# Patient Record
Sex: Male | Born: 2000 | Race: White | Hispanic: No | Marital: Single | Smoking: Never smoker
Health system: Southern US, Community
[De-identification: ages and names within clinical notes are randomized; demographics above are authoritative.]

## PROBLEM LIST (undated history)

## (undated) DIAGNOSIS — Z9229 Personal history of other drug therapy: Secondary | ICD-10-CM

## (undated) DIAGNOSIS — S83207A Unspecified tear of unspecified meniscus, current injury, left knee, initial encounter: Secondary | ICD-10-CM

## (undated) DIAGNOSIS — S8990XA Unspecified injury of unspecified lower leg, initial encounter: Secondary | ICD-10-CM

## (undated) DIAGNOSIS — T8489XA Other specified complication of internal orthopedic prosthetic devices, implants and grafts, initial encounter: Secondary | ICD-10-CM

## (undated) HISTORY — PX: TONSILLECTOMY AND ADENOIDECTOMY: SUR1326

---
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
## 2019-01-29 DIAGNOSIS — R070 Pain in throat: Secondary | ICD-10-CM | POA: Diagnosis not present

## 2019-01-29 DIAGNOSIS — R509 Fever, unspecified: Secondary | ICD-10-CM | POA: Diagnosis not present

## 2019-01-29 DIAGNOSIS — J029 Acute pharyngitis, unspecified: Secondary | ICD-10-CM | POA: Diagnosis not present

## 2019-01-29 DIAGNOSIS — B070 Plantar wart: Secondary | ICD-10-CM | POA: Diagnosis not present

## 2019-02-03 ENCOUNTER — Encounter: Payer: Self-pay | Admitting: Nurse Practitioner

## 2019-02-20 ENCOUNTER — Other Ambulatory Visit: Payer: Self-pay

## 2019-02-20 ENCOUNTER — Encounter: Payer: Self-pay | Admitting: Emergency Medicine

## 2019-02-20 DIAGNOSIS — R197 Diarrhea, unspecified: Secondary | ICD-10-CM | POA: Diagnosis not present

## 2019-02-20 DIAGNOSIS — R1084 Generalized abdominal pain: Secondary | ICD-10-CM | POA: Insufficient documentation

## 2019-02-20 LAB — COMPREHENSIVE METABOLIC PANEL
ALBUMIN: 4.6 g/dL (ref 3.5–5.0)
ALK PHOS: 49 U/L (ref 38–126)
ALT: 50 U/L — ABNORMAL HIGH (ref 0–44)
AST: 35 U/L (ref 15–41)
Anion gap: 6 (ref 5–15)
BUN: 21 mg/dL — AB (ref 6–20)
CALCIUM: 8.8 mg/dL — AB (ref 8.9–10.3)
CO2: 25 mmol/L (ref 22–32)
CREATININE: 0.92 mg/dL (ref 0.61–1.24)
Chloride: 109 mmol/L (ref 98–111)
GFR calc Af Amer: >60 mL/min (ref >60)
GFR calc non Af Amer: >60 mL/min (ref >60)
GLUCOSE: 111 mg/dL — AB (ref 70–99)
Potassium: 4.1 mmol/L (ref 3.5–5.1)
Sodium: 140 mmol/L (ref 135–145)
TOTAL PROTEIN: 6.6 g/dL (ref 6.5–8.1)
Total Bilirubin: 0.7 mg/dL (ref 0.3–1.2)

## 2019-02-20 LAB — URINALYSIS, COMPLETE (UACMP) WITH MICROSCOPIC
BILIRUBIN URINE: NEGATIVE
Bacteria, UA: NONE SEEN
Glucose, UA: NEGATIVE mg/dL
HGB URINE DIPSTICK: NEGATIVE
Ketones, ur: NEGATIVE mg/dL
Leukocytes,Ua: NEGATIVE
Nitrite: NEGATIVE
PH: 6 (ref 5.0–8.0)
Protein, ur: NEGATIVE mg/dL
SPECIFIC GRAVITY, URINE: 1.021 (ref 1.005–1.030)
WBC, UA: NONE SEEN WBC/hpf (ref 0–5)

## 2019-02-20 LAB — LIPASE, BLOOD: Lipase: 30 U/L (ref 11–51)

## 2019-02-20 LAB — CBC
HCT: 39.6 % (ref 39.0–52.0)
Hemoglobin: 14.4 g/dL (ref 13.0–17.0)
MCH: 31.7 pg (ref 26.0–34.0)
MCHC: 36.4 g/dL — AB (ref 30.0–36.0)
MCV: 87.2 fL (ref 80.0–100.0)
PLATELETS: 214 10*3/uL (ref 150–400)
RBC: 4.54 MIL/uL (ref 4.22–5.81)
RDW: 11.8 % (ref 11.5–15.5)
WBC: 4.8 10*3/uL (ref 4.0–10.5)
nRBC: 0 % (ref 0.0–0.2)

## 2019-02-20 NOTE — ED Triage Notes (Signed)
Patient ambulatory to triage with steady gait, without difficulty or distress noted; pt reports generalized abd pain since this morning accomp by diarrhea

## 2019-02-21 ENCOUNTER — Ambulatory Visit (INDEPENDENT_AMBULATORY_CARE_PROVIDER_SITE_OTHER): Payer: Medicaid Other | Admitting: Nurse Practitioner

## 2019-02-21 ENCOUNTER — Encounter: Payer: Self-pay | Admitting: Nurse Practitioner

## 2019-02-21 ENCOUNTER — Other Ambulatory Visit: Payer: Self-pay

## 2019-02-21 ENCOUNTER — Emergency Department
Admission: EM | Admit: 2019-02-21 | Discharge: 2019-02-21 | Disposition: A | Discharge status: Home / Self Care | Payer: Medicaid Other | Attending: Emergency Medicine | Admitting: Emergency Medicine

## 2019-02-21 VITALS — BP 123/65 | HR 61 | Temp 98.3°F | Ht 71.0 in | Wt 210.2 lb

## 2019-02-21 DIAGNOSIS — A084 Viral intestinal infection, unspecified: Secondary | ICD-10-CM | POA: Diagnosis not present

## 2019-02-21 NOTE — ED Notes (Signed)
Pt called from lobby with no reply. Unable to locate pt at this time. Pt did not announce leaving.

## 2019-02-21 NOTE — ED Notes (Signed)
Pt called from lobby to be taken to exam room. Unable to locate pt at this time.  

## 2019-02-21 NOTE — Patient Instructions (Addendum)
Raymond Rogers,   Thank you for coming in to clinic today.  1. Your symptoms are likely from Viral Gastroenteritis -  A stomach bug. This should significantly improve within about 24-48 hours and full improvement in about 1 week. - It is important to continue hydration.  Drink at least 2 L water daily.  You may start drinking some gatorade or powerade if you are unable to eat enough food. - Continue Tylenol every 6 hours if needed for fevers or aches and pains. - Avoid ibuprofen.  - If continue to have cramping and diarrhea, may take Imodium AD over the counter as needed. - For stomach pain, may start Pepcid 20 mg twice daily - take 14 days then only as needed.  If symptoms are worsening, decreased urine output (no urination within 12 hours), persistent fevers higher than 101F for more than 3 days, worsening or increased vomiting, persistent diarrhea after 5-7 days, decreased appetite, please call or return to clinic, may go to Urgent Care or ED.   Please schedule a follow-up appointment with Wilhelmina Mcardle, AGNP.  Return 5-7 days if symptoms worsen or fail to improve.  If you have any other questions or concerns, please feel free to call the clinic or send a message through MyChart. You may also schedule an earlier appointment if necessary.  You will receive a survey after today's visit either digitally by e-mail or paper by [employer]. Your experiences and feedback matter to [place].  Please respond so we know how we are doing as we provide care for you.   Wilhelmina Mcardle, DNP, AGNP-BC Adult Gerontology Nurse Practitioner [facility], [facility]

## 2019-02-21 NOTE — Progress Notes (Signed)
Subjective:    Patient ID: Raymond Rogers, male    DOB: 02/28/2001, 18 y.o.   MRN: [id number]  Raymond Rogers is a 18 y.o. male presenting on 02/21/2019 for Abdominal Pain (intermittent abdominal pain mostly in the upper abdomen w/ diarrhea. The pt describe the pain as a tight feeling.   He reports that the symptoms started after playing basketball. x 1 day)   HPI Abdominal pain Patient has had increased abdominal pain and diarrhea for 1.5 days.  Started 2 days ago in evening after playing basketball with mild sypmtoms.  Yesterday had abdominal cramping with diarrhea.  No fever, n/v.   - Increased frequency of BM and is watery with fluffy pieces.  Probably had 4 BM yesterday.   - Still has cramping some today after turning onto his left side this morning.  Has had no diarrhea today. - Patient had labs at ER that are reviewed today.  He left before being seen. - Patient has taken Advil and pepto bismol.  Advil helped him get some sleep last night.  No relief from pepto.  Social History   Tobacco Use  . Smoking status: Never Smoker  . Smokeless tobacco: Never Used  Substance Use Topics  . Alcohol use: Never    Frequency: Never  . Drug use: Never    Review of Systems Per HPI unless specifically indicated above     Objective:    BP 123/65 (BP Location: Right Arm, Patient Position: Sitting, Cuff Size: Normal)   Pulse 61   Temp 98.3 F (36.8 C) (Oral)   Ht 5\' 11"  (1.803 m)   Wt 210 lb 3.2 oz (95.3 kg)   BMI 29.32 kg/m   Wt Readings from Last 3 Encounters:  02/21/19 210 lb 3.2 oz (95.3 kg) (96 %, Z= 1.75)*  02/20/19 185 lb (83.9 kg) (88 %, Z= 1.16)*  11/29/18 187 lb 3.2 oz (84.9 kg) (89 %, Z= 1.24)*   * Growth percentiles are based on CDC (Boys, 2-20 Years) data.    Physical Exam Vitals signs reviewed.  Constitutional:      General: He is not in acute distress.    Appearance: He is well-developed.  HENT:     Head: Normocephalic and atraumatic.  Cardiovascular:   Rate and Rhythm: Normal rate and regular rhythm.     Pulses:          Radial pulses are 2+ on the right side and 2+ on the left side.       Posterior tibial pulses are 1+ on the right side and 1+ on the left side.     Heart sounds: Normal heart sounds, S1 normal and S2 normal.  Pulmonary:     Effort: Pulmonary effort is normal. No respiratory distress.     Breath sounds: Normal breath sounds and air entry.  Abdominal:     General: Bowel sounds are normal. There is no distension.     Palpations: Abdomen is soft.     Tenderness: There is generalized abdominal tenderness.     Hernia: No hernia is present.  Musculoskeletal:     Right lower leg: No edema.     Left lower leg: No edema.  Skin:    General: Skin is warm and dry.     Capillary Refill: Capillary refill takes less than 2 seconds.  Neurological:     Mental Status: He is alert and oriented to person, place, and time.  Psychiatric:  Attention and Perception: Attention normal.        Mood and Affect: Mood and affect normal.        Behavior: Behavior normal. Behavior is cooperative.    Results for orders placed or performed during the hospital encounter of 02/21/19  Lipase, blood  Result Value Ref Range   Lipase 30 11 - 51 U/L  Comprehensive metabolic panel  Result Value Ref Range   Sodium 140 135 - 145 mmol/L   Potassium 4.1 3.5 - 5.1 mmol/L   Chloride 109 98 - 111 mmol/L   CO2 25 22 - 32 mmol/L   Glucose, Bld 111 (H) 70 - 99 mg/dL   BUN 21 (H) 6 - 20 mg/dL   Creatinine, Ser 9.93 0.61 - 1.24 mg/dL   Calcium 8.8 (L) 8.9 - 10.3 mg/dL   Total Protein 6.6 6.5 - 8.1 g/dL   Albumin 4.6 3.5 - 5.0 g/dL   AST 35 15 - 41 U/L   ALT 50 (H) 0 - 44 U/L   Alkaline Phosphatase 49 38 - 126 U/L   Total Bilirubin 0.7 0.3 - 1.2 mg/dL   GFR calc non Af Amer >60 >60 mL/min   GFR calc Af Amer >60 >60 mL/min   Anion gap 6 5 - 15  CBC  Result Value Ref Range   WBC 4.8 4.0 - 10.5 K/uL   RBC 4.54 4.22 - 5.81 MIL/uL   Hemoglobin 14.4  13.0 - 17.0 g/dL   HCT 57.0 17.7 - 93.9 %   MCV 87.2 80.0 - 100.0 fL   MCH 31.7 26.0 - 34.0 pg   MCHC 36.4 (H) 30.0 - 36.0 g/dL   RDW 03.0 09.2 - 33.0 %   Platelets 214 150 - 400 K/uL   nRBC 0.0 0.0 - 0.2 %  Urinalysis, Complete w Microscopic  Result Value Ref Range   Color, Urine YELLOW (A) YELLOW   APPearance CLEAR (A) CLEAR   Specific Gravity, Urine 1.021 1.005 - 1.030   pH 6.0 5.0 - 8.0   Glucose, UA NEGATIVE NEGATIVE mg/dL   Hgb urine dipstick NEGATIVE NEGATIVE   Bilirubin Urine NEGATIVE NEGATIVE   Ketones, ur NEGATIVE NEGATIVE mg/dL   Protein, ur NEGATIVE NEGATIVE mg/dL   Nitrite NEGATIVE NEGATIVE   Leukocytes,Ua NEGATIVE NEGATIVE   WBC, UA NONE SEEN 0 - 5 WBC/hpf   Bacteria, UA NONE SEEN NONE SEEN   Squamous Epithelial / LPF 0-5 0 - 5      Assessment & Plan:   Problem List Items Addressed This Visit    None    Visit Diagnoses    Viral gastroenteritis    -  Primary    Acute viral gastroenteritis likely.  Patient also slightly dehydrated from illness.  Resolving some at this time, but continues to have mild diarrhea, stomach pain.  Plan: 1. Encouraged hydration 2. For fever, aches may take OTC acetaminophen, ibuprofen 3. May take Imodium AD prn diarrhea 4. May also have gastritis - start Pepcid 20 mg one tab bid x 14 days 5. Follow-up prn 5-7 days  Follow up plan: Return 5-7 days if symptoms worsen or fail to improve.  Wilhelmina Mcardle, DNP, AGPCNP-BC Adult Gerontology Primary Care Nurse Practitioner [facility] 02/21/2019, 9:57 AM

## 2019-02-21 NOTE — ED Notes (Signed)
Pt called from lobby with no reply. Unable to locate pt.  

## 2019-02-24 ENCOUNTER — Encounter: Payer: Self-pay | Admitting: Nurse Practitioner

## 2019-03-24 ENCOUNTER — Telehealth: Payer: Self-pay

## 2019-03-24 NOTE — Telephone Encounter (Signed)
Pt was sent activation information to activity his mychart.

## 2019-10-14 ENCOUNTER — Encounter: Payer: Self-pay | Admitting: Nurse Practitioner

## 2020-03-02 ENCOUNTER — Encounter: Payer: Medicaid Other | Admitting: Family Medicine

## 2020-03-09 ENCOUNTER — Other Ambulatory Visit: Payer: Self-pay

## 2020-03-09 ENCOUNTER — Encounter: Payer: Self-pay | Admitting: Family Medicine

## 2020-03-09 ENCOUNTER — Ambulatory Visit (INDEPENDENT_AMBULATORY_CARE_PROVIDER_SITE_OTHER): Payer: Medicaid Other | Admitting: Family Medicine

## 2020-03-09 VITALS — BP 136/56 | HR 59 | Temp 97.7°F | Resp 16 | Ht 72.0 in | Wt 229.6 lb

## 2020-03-09 DIAGNOSIS — L659 Nonscarring hair loss, unspecified: Secondary | ICD-10-CM | POA: Diagnosis not present

## 2020-03-09 DIAGNOSIS — Z114 Encounter for screening for human immunodeficiency virus [HIV]: Secondary | ICD-10-CM | POA: Diagnosis not present

## 2020-03-09 DIAGNOSIS — R7309 Other abnormal glucose: Secondary | ICD-10-CM | POA: Diagnosis not present

## 2020-03-09 DIAGNOSIS — Z0000 Encounter for general adult medical examination without abnormal findings: Secondary | ICD-10-CM

## 2020-03-09 DIAGNOSIS — Z8349 Family history of other endocrine, nutritional and metabolic diseases: Secondary | ICD-10-CM | POA: Diagnosis not present

## 2020-03-09 NOTE — Progress Notes (Signed)
Subjective:    Patient ID: Raymond Rogers, male    DOB: Mar 04, 2001, 19 y.o.   MRN: [id number]  Raymond Rogers is a 19 y.o. male presenting on 03/09/2020 for Annual Exam   HPI   Here for Annual Physical and Lab Review.  Lifestyle He is doing well overall. He is managing with his studies due to COVID previously was doing online classes Denies any depression, anxiety mood issues, sleep problem. 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 Fam history of hypothyroidism - maternal grandfather, mother, aunt and uncle  Health Maintenance: UTD Td vaccine 02/2011, not due for Tdap booster yet.  Depression screen [facility] 2/9 03/09/2020 07/24/2018  Decreased Interest 0 0  Down, Depressed, Hopeless 0 0  PHQ - 2 Score 0 0  Altered sleeping - 0  Tired, decreased energy - 0  Change in appetite - 0  Feeling bad or failure about yourself  - 0  Trouble concentrating - 0  Moving slowly or fidgety/restless - 0  Suicidal thoughts - 0  PHQ-9 Score - 0  Difficult doing work/chores - Not difficult at all    Past Medical History:  Diagnosis Date  . ACL graft tear (HCC)    LEFT  . Acute meniscal tear of left knee   . Immunizations up to date   . Injury of knee, ligament    ANTERIOR LATERAL LIGAMENT TEAR   Past Surgical History:  Procedure Laterality Date  . ANTERIOR CRUCIATE LIGAMENT (ACL) REVISION Left 10/26/2016   Procedure: ANTERIOR CRUCIATE LIGAMENT (ACL) RECONSTUCTION REVISION WITH ALLOGRAFT AND (ALL) ANTERIOR LATERAL LIGAMENT RECONSTRUCTION WITH ALLOGRAFT;  Surgeon: Eugenia Mcalpine, MD;  Location: [facility];  Service: Orthopedics;  Laterality: Left;  . KNEE ARTHROSCOPY WITH ANTERIOR CRUCIATE LIGAMENT (ACL) REPAIR Left 10/12/2015   Procedure: LEFT ARTHROSCOPY KNEE WITH DEBRIDEMENT, AUTOGRAFT ACL RECONSTRUCTION;  Surgeon: Eugenia Mcalpine, MD;  Location: [facility];  Service: Orthopedics;  Laterality: Left;  ANESTHESIA: GENERAL, ADDUCTOR CANAL BLOCK, KNEE BLOCK  . KNEE ARTHROSCOPY WITH LATERAL MENISECTOMY Left 10/26/2016   Procedure: LEFT KNEE ARTHROSCOPY WITH PARTIAL LATERAL MENISECTOMY;  Surgeon: Eugenia Mcalpine, MD;  Location: [facility];  Service: Orthopedics;  Laterality: Left;  . TONSILLECTOMY AND ADENOIDECTOMY  age 45   Social History   Socioeconomic History  . Marital status: Single    Spouse name: Not on file  . Number of children: Not on file  . Years of education: Not on file  . Highest education level: High school graduate  Occupational History  . Occupation: Consulting civil engineer    Comment: [employer]  Tobacco Use  . Smoking status: Never Smoker  . Smokeless tobacco: Never Used  Substance and Sexual Activity  . Alcohol use: Never  . Drug use: Never  . Sexual activity: Never  Other Topics Concern  . Not on file  Social History Narrative   NO PT/ FAMILY ANESTHESIA PROBLEMS.   - NO SMOKER IN HOME.   - Graduated [employer], Plays Football,  Will attend [employer].   Social Determinants of Health   Financial Resource Strain:   . Difficulty of  Paying Living Expenses:   Food Insecurity:   . Worried About Programme researcher, broadcasting/film/video in the Last Year:   . Barista in the Last Year:   Transportation Needs:   . Freight forwarder (Medical):   Marland Kitchen Lack of Transportation (Non-Medical):   Physical Activity:   . Days of Exercise per Week:   . Minutes of Exercise per Session:   Stress:   . Feeling of Stress :   Social Connections:   . Frequency of Communication with Friends and Family:   . Frequency of Social Gatherings with Friends and Family:   .  Attends Religious Services:   . Active Member of Clubs or Organizations:   . Attends Banker Meetings:   Marland Kitchen Marital Status:   Intimate Partner Violence:   . Fear of Current or Ex-Partner:   . Emotionally Abused:   Marland Kitchen Physically Abused:   . Sexually Abused:    Family History  Problem Relation Age of Onset  . Healthy Brother   . Healthy Brother   . Cholelithiasis Father   . Thyroid disease Mother   . Diabetes Maternal Grandmother   . Diabetes Maternal Grandfather   . Thyroid disease Paternal Grandmother    Current Outpatient Medications on File Prior to Visit  Medication Sig  . bismuth subsalicylate (PEPTO BISMOL) 262 MG chewable tablet Chew 524 mg by mouth as needed.  Marland Kitchen ibuprofen (ADVIL,MOTRIN) 200 MG tablet Take 200 mg by mouth every 6 (six) hours as needed.   No current facility-administered medications on file prior to visit.    Review of Systems  Constitutional: Negative for activity change, appetite change, chills, diaphoresis, fatigue and fever.  HENT: Negative for congestion and hearing loss.   Eyes: Negative for visual disturbance.  Respiratory: Negative for apnea, cough, chest tightness, shortness of breath and wheezing.   Cardiovascular: Negative for chest pain, palpitations and leg swelling.  Gastrointestinal: Negative for abdominal pain, anal bleeding, blood in stool, constipation, diarrhea, nausea and vomiting.  Endocrine: Negative for cold intolerance.  Genitourinary: Negative for decreased urine volume, difficulty urinating, dysuria, frequency, hematuria and urgency.  Musculoskeletal: Negative for arthralgias, back pain and neck pain.  Skin: Negative for rash.  Allergic/Immunologic: Negative for environmental allergies.  Neurological: Negative for dizziness, weakness, light-headedness, numbness and headaches.  Hematological: Negative for adenopathy.  Psychiatric/Behavioral: Negative for behavioral problems, dysphoric mood and sleep disturbance. The  patient is not nervous/anxious.    Per HPI unless specifically indicated above     Objective:    BP (!) 136/56   Pulse (!) 59   Temp 97.7 F (36.5 C) (Temporal)   Resp 16   Ht 6' (1.829 m)   Wt 229 lb 9.6 oz (104.1 kg)   SpO2 100%   BMI 31.14 kg/m   Wt Readings from Last 3 Encounters:  03/09/20 229 lb 9.6 oz (104.1 kg) (98 %, Z= 2.06)*  02/21/19 210 lb 3.2 oz (95.3 kg) (96 %, Z= 1.75)*  02/20/19 185 lb (83.9 kg) (88 %, Z= 1.16)*   * Growth percentiles are based on CDC (Boys, 2-20 Years) data.    Physical Exam Vitals and nursing note reviewed.  Constitutional:      General: He is not in acute distress.    Appearance: He is well-developed. He is not diaphoretic.     Comments: Well-appearing, comfortable, cooperative  HENT:     Head: Normocephalic and atraumatic.  Eyes:     General:  Right eye: No discharge.        Left eye: No discharge.     Conjunctiva/sclera: Conjunctivae normal.     Pupils: Pupils are equal, round, and reactive to light.  Neck:     Thyroid: No thyromegaly.  Cardiovascular:     Rate and Rhythm: Normal rate and regular rhythm.     Heart sounds: Normal heart sounds. No murmur.  Pulmonary:     Effort: Pulmonary effort is normal. No respiratory distress.     Breath sounds: Normal breath sounds. No wheezing or rales.  Abdominal:     General: Bowel sounds are normal. There is no distension.     Palpations: Abdomen is soft. There is no mass.     Tenderness: There is no abdominal tenderness.  Musculoskeletal:        General: No tenderness. Normal range of motion.     Cervical back: Normal range of motion and neck supple.     Comments: Upper / Lower Extremities: - Normal muscle tone, strength bilateral upper extremities 5/5, lower extremities 5/5  Lymphadenopathy:     Cervical: No cervical adenopathy.  Skin:    General: Skin is warm and dry.     Findings: No erythema or rash.     Comments: Lower chin line left sided, 2-3 cm area of some hair  loss, but now has some hair regrowth, no scarring or inflammation of skin  Neurological:     Mental Status: He is alert and oriented to person, place, and time.     Comments: Distal sensation intact to light touch all extremities  Psychiatric:        Behavior: Behavior normal.     Comments: Well groomed, good eye contact, normal speech and thoughts    Results for orders placed or performed during the hospital encounter of 02/21/19  Lipase, blood  Result Value Ref Range   Lipase 30 11 - 51 U/L  Comprehensive metabolic panel  Result Value Ref Range   Sodium 140 135 - 145 mmol/L   Potassium 4.1 3.5 - 5.1 mmol/L   Chloride 109 98 - 111 mmol/L   CO2 25 22 - 32 mmol/L   Glucose, Bld 111 (H) 70 - 99 mg/dL   BUN 21 (H) 6 - 20 mg/dL   Creatinine, Ser 8.41 0.61 - 1.24 mg/dL   Calcium 8.8 (L) 8.9 - 10.3 mg/dL   Total Protein 6.6 6.5 - 8.1 g/dL   Albumin 4.6 3.5 - 5.0 g/dL   AST 35 15 - 41 U/L   ALT 50 (H) 0 - 44 U/L   Alkaline Phosphatase 49 38 - 126 U/L   Total Bilirubin 0.7 0.3 - 1.2 mg/dL   GFR calc non Af Amer >60 >60 mL/min   GFR calc Af Amer >60 >60 mL/min   Anion gap 6 5 - 15  CBC  Result Value Ref Range   WBC 4.8 4.0 - 10.5 K/uL   RBC 4.54 4.22 - 5.81 MIL/uL   Hemoglobin 14.4 13.0 - 17.0 g/dL   HCT 32.4 40.1 - 02.7 %   MCV 87.2 80.0 - 100.0 fL   MCH 31.7 26.0 - 34.0 pg   MCHC 36.4 (H) 30.0 - 36.0 g/dL   RDW 25.3 66.4 - 40.3 %   Platelets 214 150 - 400 K/uL   nRBC 0.0 0.0 - 0.2 %  Urinalysis, Complete w Microscopic  Result Value Ref Range   Color, Urine YELLOW (A) YELLOW   APPearance CLEAR (A) CLEAR  Specific Gravity, Urine 1.021 1.005 - 1.030   pH 6.0 5.0 - 8.0   Glucose, UA NEGATIVE NEGATIVE mg/dL   Hgb urine dipstick NEGATIVE NEGATIVE   Bilirubin Urine NEGATIVE NEGATIVE   Ketones, ur NEGATIVE NEGATIVE mg/dL   Protein, ur NEGATIVE NEGATIVE mg/dL   Nitrite NEGATIVE NEGATIVE   Leukocytes,Ua NEGATIVE NEGATIVE   WBC, UA NONE SEEN 0 - 5 WBC/hpf   Bacteria, UA NONE  SEEN NONE SEEN   Squamous Epithelial / LPF 0-5 0 - 5      Assessment & Plan:   Problem List Items Addressed This Visit    None    Visit Diagnoses    Annual physical exam    -  Primary   Relevant Orders   Hemoglobin A1c   CBC with Differential/Platelet   COMPLETE METABOLIC PANEL WITH GFR   Lipid panel   TSH   Abnormal glucose       Relevant Orders   Hemoglobin A1c   Family history of hypothyroidism       Relevant Orders   TSH   Hair loss       Relevant Orders   TSH   Screening for HIV (human immunodeficiency virus)       Relevant Orders   HIV Antibody (routine testing w rflx)      Updated Health Maintenance information - Vaccines updated from [facility] record - TDap due in 2022 Ordered fasting labs today, will follow-up results Encouraged improvement to lifestyle with diet and exercise Maintain healthy weight / muscle mass with weight training  Reassurance with localized hair loss, but will check TSH as requested due to family history. Routine HIV screening  No orders of the defined types were placed in this encounter.     Follow up plan: Return in about 1 year (around 03/09/2021) for Annual Physical.  Nobie Putnam, DO [facility] 03/09/2020, 8:42 AM

## 2020-03-09 NOTE — Patient Instructions (Addendum)
Thank you for coming to the office today.  Blood work today including thyroid  Good for 1 year follow-up, unless needed sooner  Please schedule a Follow-up Appointment to: Return in about 1 year (around 03/09/2021) for Annual Physical.  If you have any other questions or concerns, please feel free to call the office or send a message through MyChart. You may also schedule an earlier appointment if necessary.  Additionally, you may be receiving a survey about your experience at our office within a few days to 1 week by e-mail or mail. We value your feedback.  Saralyn Pilar, DO [facility], [place]

## 2020-03-10 LAB — COMPLETE METABOLIC PANEL WITH GFR
AG Ratio: 2.3 (calc) (ref 1.0–2.5)
ALT: 52 U/L — ABNORMAL HIGH (ref 8–46)
AST: 30 U/L (ref 12–32)
Albumin: 4.6 g/dL (ref 3.6–5.1)
Alkaline phosphatase (APISO): 58 U/L (ref 46–169)
BUN: 19 mg/dL (ref 7–20)
CO2: 24 mmol/L (ref 20–32)
Calcium: 9.5 mg/dL (ref 8.9–10.4)
Chloride: 107 mmol/L (ref 98–110)
Creat: 1.04 mg/dL (ref 0.60–1.26)
GFR, Est African American: 120 mL/min/{1.73_m2} (ref >=60)
GFR, Est Non African American: 104 mL/min/{1.73_m2} (ref >=60)
Globulin: 2 g/dL (calc) — ABNORMAL LOW (ref 2.1–3.5)
Glucose, Bld: 93 mg/dL (ref 65–99)
Potassium: 4.1 mmol/L (ref 3.8–5.1)
Sodium: 140 mmol/L (ref 135–146)
Total Bilirubin: 0.8 mg/dL (ref 0.2–1.1)
Total Protein: 6.6 g/dL (ref 6.3–8.2)

## 2020-03-10 LAB — CBC WITH DIFFERENTIAL/PLATELET
Absolute Monocytes: 328 cells/uL (ref 200–950)
Basophils Absolute: 8 cells/uL (ref 0–200)
Basophils Relative: 0.2 %
Eosinophils Absolute: 60 cells/uL (ref 15–500)
Eosinophils Relative: 1.5 %
HCT: 43.8 % (ref 38.5–50.0)
Hemoglobin: 15.5 g/dL (ref 13.2–17.1)
Lymphs Abs: 1840 cells/uL (ref 850–3900)
MCH: 31.4 pg (ref 27.0–33.0)
MCHC: 35.4 g/dL (ref 32.0–36.0)
MCV: 88.7 fL (ref 80.0–100.0)
MPV: 11.2 fL (ref 7.5–12.5)
Monocytes Relative: 8.2 %
Neutro Abs: 1764 cells/uL (ref 1500–7800)
Neutrophils Relative %: 44.1 %
Platelets: 213 10*3/uL (ref 140–400)
RBC: 4.94 10*6/uL (ref 4.20–5.80)
RDW: 12.5 % (ref 11.0–15.0)
Total Lymphocyte: 46 %
WBC: 4 10*3/uL (ref 3.8–10.8)

## 2020-03-10 LAB — LIPID PANEL
Cholesterol: 165 mg/dL (ref <170)
HDL: 45 mg/dL — ABNORMAL LOW (ref >45)
LDL Cholesterol (Calc): 99 mg/dL (calc) (ref <110)
Non-HDL Cholesterol (Calc): 120 mg/dL (calc) — ABNORMAL HIGH (ref <120)
Total CHOL/HDL Ratio: 3.7 (calc) (ref <5.0)
Triglycerides: 110 mg/dL — ABNORMAL HIGH (ref <90)

## 2020-03-10 LAB — HEMOGLOBIN A1C
Hgb A1c MFr Bld: 4.5 % of total Hgb (ref <5.7)
Mean Plasma Glucose: 82 (calc)
eAG (mmol/L): 4.6 (calc)

## 2020-03-10 LAB — HIV ANTIBODY (ROUTINE TESTING W REFLEX): HIV 1&2 Ab, 4th Generation: NONREACTIVE

## 2020-03-10 LAB — TSH: TSH: 1.43 mIU/L (ref 0.50–4.30)

## 2020-09-09 ENCOUNTER — Other Ambulatory Visit: Payer: Medicaid Other
